# Patient Record
Sex: Male | Born: 1980 | Hispanic: No | Marital: Married | Smoking: Never smoker
Health system: Southern US, Community
[De-identification: ages and names within clinical notes are randomized; demographics above are authoritative.]

---

## 2010-03-06 ENCOUNTER — Encounter: Admission: RE | Admit: 2010-03-06 | Discharge: 2010-03-06 | Payer: Self-pay | Admitting: Orthopaedic Surgery

## 2015-01-17 ENCOUNTER — Other Ambulatory Visit: Payer: Self-pay | Admitting: Hematology

## 2015-01-17 ENCOUNTER — Other Ambulatory Visit: Payer: Self-pay | Admitting: Orthopedic Surgery

## 2015-01-17 ENCOUNTER — Other Ambulatory Visit: Payer: Self-pay

## 2015-01-17 DIAGNOSIS — M545 Low back pain: Secondary | ICD-10-CM

## 2016-03-18 ENCOUNTER — Emergency Department (HOSPITAL_COMMUNITY): Admission: EM | Admit: 2016-03-18 | Discharge: 2016-03-18 | Discharge status: Absent without leave | Payer: Self-pay

## 2016-06-04 ENCOUNTER — Telehealth (INDEPENDENT_AMBULATORY_CARE_PROVIDER_SITE_OTHER): Payer: Self-pay | Admitting: *Deleted

## 2016-06-04 NOTE — Telephone Encounter (Signed)
IC and advised we have not seen patient since 12/2014, and appt will be needed.  Patient is only in town on Friday afternoons, appt made for 4pm with Erin on 06/11/16

## 2016-06-04 NOTE — Telephone Encounter (Signed)
Patient's wife called this morning in regards to wanting a prescription for pain medicine. CB # [phone]. Thank you.

## 2016-06-11 ENCOUNTER — Ambulatory Visit (INDEPENDENT_AMBULATORY_CARE_PROVIDER_SITE_OTHER): Payer: Managed Care, Other (non HMO) | Admitting: Family

## 2016-06-11 DIAGNOSIS — M545 Low back pain, unspecified: Secondary | ICD-10-CM

## 2016-06-11 DIAGNOSIS — G8929 Other chronic pain: Secondary | ICD-10-CM | POA: Diagnosis not present

## 2016-06-11 MED ORDER — HYDROCODONE-ACETAMINOPHEN 5-325 MG PO TABS: 1.0000 | ORAL_TABLET | Freq: Four times a day (QID) | ORAL | 0 refills | Status: DC | PRN

## 2016-06-11 MED ORDER — METHOCARBAMOL 500 MG PO TABS: 500.0000 mg | ORAL_TABLET | Freq: Four times a day (QID) | ORAL | 0 refills | Status: AC

## 2016-06-14 NOTE — Progress Notes (Signed)
   Office Visit Note   Patient: Colin Bartlett           Date of Birth: 03/29/1981           MRN: [id number] Visit Date: 06/11/2016              Requested by: No referring provider defined for this encounter. PCP: No primary care provider on file.  No chief complaint on file.   HPI: The patient is a 36 year old gentleman who presents today for evaluation of chronic low back pain that has acutely worsened. Does manual labor for work. Complains of worse right sided low back pain. No recent injury. No numbness or tingling. No weakness. Some soft tissue pain with spasm on the right.   This is worst in the morning. Pain interferres with his ability to work.     Assessment & Plan: Visit Diagnoses:  1. Chronic left-sided low back pain without sciatica     Plan: Have provided a short course of Norco as well as prescription for Robaxin. Reinforced back strengthening exercises. Will follow up in office as needed.   Follow-Up Instructions: No Follow-up on file.   Physical Exam  Constitutional: Appears well-developed.  Head: Normocephalic.  Eyes: EOM are normal.  Neck: Normal range of motion.  Cardiovascular: Normal rate.   Pulmonary/Chest: Effort normal.  Neurological: Is alert.  Skin: Skin is warm.  Psychiatric: Has a normal mood and affect.  Back Exam   Tenderness  Back tenderness location: no spinous process tenderness, soft tissue tenderness on right.  Range of Motion  The patient has normal back ROM.  Other  Sensation: normal Gait: normal   Comments:  No palpable spasm.       Imaging: No results found.  Orders:  No orders of the defined types were placed in this encounter.  Meds ordered this encounter  Medications  . HYDROcodone-acetaminophen (NORCO/VICODIN) 5-325 MG tablet    Sig: Take 1 tablet by mouth every 6 (six) hours as needed for moderate pain.    Dispense:  28 tablet    Refill:  0  . methocarbamol (ROBAXIN) 500 MG tablet    Sig: Take 1  tablet (500 mg total) by mouth 4 (four) times daily.    Dispense:  30 tablet    Refill:  0     Procedures: No procedures performed  Clinical Data: No additional findings.  Subjective: Review of Systems  Objective: Vital Signs: There were no vitals taken for this visit.  Specialty Comments:  No specialty comments available.  PMFS History: There are no active problems to display for this patient.  No past medical history on file.  No family history on file.  No past surgical history on file. Social History   Occupational History  . Not on file.   Social History Main Topics  . Smoking status: Not on file  . Smokeless tobacco: Not on file  . Alcohol use Not on file  . Drug use: Unknown  . Sexual activity: Not on file

## 2017-02-18 ENCOUNTER — Telehealth (INDEPENDENT_AMBULATORY_CARE_PROVIDER_SITE_OTHER): Payer: Self-pay | Admitting: Orthopaedic Surgery

## 2017-02-18 ENCOUNTER — Ambulatory Visit (INDEPENDENT_AMBULATORY_CARE_PROVIDER_SITE_OTHER): Payer: Managed Care, Other (non HMO) | Admitting: Orthopaedic Surgery

## 2017-02-18 NOTE — Telephone Encounter (Signed)
Patient last saw Denny Peonrin in February. He was to return prn. Would you like for me to call him and have him make appt?

## 2017-02-18 NOTE — Telephone Encounter (Signed)
Patients wife called on behalf of her husband, asking for a refill on his hydrocodone. CB # [phone]

## 2017-05-20 ENCOUNTER — Telehealth (INDEPENDENT_AMBULATORY_CARE_PROVIDER_SITE_OTHER): Payer: Self-pay | Admitting: Orthopaedic Surgery

## 2017-05-20 NOTE — Telephone Encounter (Signed)
Hydrocodone-acetaminophen

## 2017-05-20 NOTE — Telephone Encounter (Signed)
Please call pt is earlier appt opens pt is in a lot of pain

## 2017-05-20 NOTE — Telephone Encounter (Signed)
I left voicemail. Advised we are unable to refill pain medications as patient was last seen in our office February of 2018.  Advised to call and make return office visit if continued problems. Medication would be addressed at appointment if appropriate.

## 2017-05-24 NOTE — Telephone Encounter (Signed)
Can you work this patient in to the schedule tomorrow if there is time. He is established patient who has back pain. Thanks.

## 2017-05-24 NOTE — Telephone Encounter (Signed)
Left message for patient to return call.

## 2017-05-27 ENCOUNTER — Ambulatory Visit (INDEPENDENT_AMBULATORY_CARE_PROVIDER_SITE_OTHER): Payer: Managed Care, Other (non HMO)

## 2017-05-27 ENCOUNTER — Ambulatory Visit (INDEPENDENT_AMBULATORY_CARE_PROVIDER_SITE_OTHER): Payer: Managed Care, Other (non HMO) | Admitting: Orthopaedic Surgery

## 2017-05-27 ENCOUNTER — Encounter (INDEPENDENT_AMBULATORY_CARE_PROVIDER_SITE_OTHER): Payer: Self-pay | Admitting: Orthopaedic Surgery

## 2017-05-27 VITALS — BP 137/93 | HR 80 | Ht 67.0 in | Wt 195.0 lb

## 2017-05-27 DIAGNOSIS — M545 Low back pain: Secondary | ICD-10-CM | POA: Diagnosis not present

## 2017-05-27 DIAGNOSIS — M48061 Spinal stenosis, lumbar region without neurogenic claudication: Secondary | ICD-10-CM

## 2017-06-17 ENCOUNTER — Ambulatory Visit (INDEPENDENT_AMBULATORY_CARE_PROVIDER_SITE_OTHER): Payer: Managed Care, Other (non HMO) | Admitting: Orthopaedic Surgery

## 2017-06-27 ENCOUNTER — Encounter (INDEPENDENT_AMBULATORY_CARE_PROVIDER_SITE_OTHER): Payer: Self-pay | Admitting: Orthopaedic Surgery

## 2017-06-27 NOTE — Progress Notes (Signed)
Office Visit Note   Patient: Colin Bartlett           Date of Birth: 1980/05/04           MRN: [id number] Visit Date: 05/27/2017              Requested by: No referring provider defined for this encounter. PCP: Patient, No Pcp Per   Assessment & Plan: Visit Diagnoses:  1. Acute bilateral low back pain, with sciatica presence unspecified   2. Spinal stenosis of lumbar region without neurogenic claudication     Plan: Patient has some disc degeneration and stenosis noted on previous myelogram CT scan 2011 most significant at L4-5.  If symptoms worsen we discussed potential epidural injection.  If this is not effective he would require repeat imaging before consideration of surgical intervention. Follow-Up Instructions: Return if symptoms worsen or fail to improve.   Orders:  Orders Placed This Encounter  Procedures  . XR Lumbar Spine 2-3 Views   No orders of the defined types were placed in this encounter.     Procedures: No procedures performed   Clinical Data: No additional findings.   Subjective: Chief Complaint  Patient presents with  . Lower Back - Pain    HPI 37 year old male [place] in [place] seen with ongoing problems with low back pain.  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  He has no persistent numbness or tingling occasionally has had spasms in his right leg that extend to the calf the last for short period of time.  He has had problems with his back in the past.  He denies recent injury.  Denies weakness. Previous lumbar myelogram CT scan 2011 showed moderate central stenosis at L4-5, central and right disc extrusion at L5-S1 with facet arthropathy.  Increased stenosis with flexion at L4-5.  Central protrusions at L1-2, L2-3 and L3-4. Review of Systems 14 point review of systems positive for history of GI reflux.  Impingement right  shoulder.  Non-smoker.   Objective: Vital Signs: BP (!) 137/93   Pulse 80   Ht 5\' 7"  (1.702 m)   Wt 195 lb (88.5 kg)   BMI 30.54 kg/m   Physical Exam  Constitutional: He is oriented to person, place, and time. He appears well-developed and well-nourished.  HENT:  Head: Normocephalic and atraumatic.  Eyes: EOM are normal. Pupils are equal, round, and reactive to light.  Neck: No tracheal deviation present. No thyromegaly present.  Cardiovascular: Normal rate.  Pulmonary/Chest: Effort normal. He has no wheezes.  Abdominal: Soft. Bowel sounds are normal.  Neurological: He is alert and oriented to person, place, and time.  Skin: Skin is warm and dry. Capillary refill takes less than 2 seconds.  Psychiatric: He has a normal mood and affect. His behavior is normal. Judgment and thought content normal.    Ortho Exam patient has some pain with palpation lumbar spine.  Mild sciatic notch tenderness some increased pain with straight leg raising at 80 degrees on the right.  Anterior tib EHL is intact reflexes are intact.  He is able to heel and toe walk.  Specialty Comments:  No specialty comments available.  Imaging: Xr Lumbar Spine 2-3 Views  Result Date: 06/27/2017 AP and lateral lumbar spine x-rays are obtained and reviewed.  This shows some right lower quadrant vascular metal clips.  No spondylolisthesis.  No acute fracture.  Mild calcification posterior longitudinal ligament noted at L3, L4 and L5.  Pars are intact. Impression: lumbar spine x-rays negative for acute changes    PMFS History: There are no active problems to display for this patient.  History reviewed. No pertinent past medical history.  History reviewed. No pertinent family history.  History reviewed. No pertinent surgical history. Social History   Occupational History  . Not on file  Tobacco Use  . Smoking status: Never Smoker  . Smokeless tobacco: Never Used  Substance and Sexual Activity  . Alcohol use:  No    Frequency: Never  . Drug use: No  . Sexual activity: No

## 2017-12-12 ENCOUNTER — Telehealth (INDEPENDENT_AMBULATORY_CARE_PROVIDER_SITE_OTHER): Payer: Self-pay | Admitting: Orthopaedic Surgery

## 2017-12-12 MED ORDER — ACETAMINOPHEN-CODEINE #3 300-30 MG PO TABS: 1.0000 | ORAL_TABLET | Freq: Two times a day (BID) | ORAL | 0 refills | Status: DC | PRN

## 2017-12-12 NOTE — Telephone Encounter (Signed)
Called medication to pharmacy

## 2017-12-12 NOTE — Telephone Encounter (Signed)
Fonnie BirkenheadGuilda called, she states the [place] [facility] [place] at [address]. Also she states patient is at work but she will check with him and call back tomorrow to let you know if he would like to proceed with new MRI. # [phone]

## 2017-12-12 NOTE — Telephone Encounter (Signed)
Please advise 

## 2017-12-12 NOTE — Telephone Encounter (Signed)
I left voicemail requesting return call with name of pharmacy patient would like med called to and to let me know if they would like to proceed with MRI order.

## 2017-12-12 NOTE — Telephone Encounter (Signed)
OK for tylenol # 3   # 20 1 po bid prn. If he is still hurting then needs new MRI and may need surgery to correct . ucall thanks

## 2017-12-12 NOTE — Telephone Encounter (Signed)
Patient requesting rx refill on hydrocodone. CB # [phone]

## 2019-04-02 ENCOUNTER — Telehealth: Payer: Self-pay | Admitting: Orthopaedic Surgery

## 2019-04-02 MED ORDER — ACETAMINOPHEN-CODEINE #3 300-30 MG PO TABS: 1.0000 | ORAL_TABLET | Freq: Two times a day (BID) | ORAL | 0 refills | Status: DC | PRN

## 2019-04-02 NOTE — Telephone Encounter (Signed)
Patient called and stated she needed refill for Hydrocodone  For patient  [phone]

## 2019-04-02 NOTE — Telephone Encounter (Signed)
Called to pharmacy. I left voicemail for patient advising. °

## 2019-04-02 NOTE — Telephone Encounter (Signed)
Ok refill tylenol # 3 same as before thank you.

## 2019-04-02 NOTE — Telephone Encounter (Signed)
Please advise 

## 2019-04-03 NOTE — Telephone Encounter (Signed)
Pt called in returning your call, I did advise the pt that the medication had been sent to the pharmacy but pt states she called the pharmacy and they said they have not received anything? Pharmacy is [facility] on [address]  Please give pt a call    [phone]

## 2019-04-03 NOTE — Telephone Encounter (Signed)
I called and spoke with pharmacy. Med has been filled and is ready to pick up. I left voicemail advising.

## 2019-10-18 ENCOUNTER — Other Ambulatory Visit: Payer: Self-pay

## 2019-10-18 ENCOUNTER — Ambulatory Visit (INDEPENDENT_AMBULATORY_CARE_PROVIDER_SITE_OTHER): Payer: Managed Care, Other (non HMO) | Admitting: Orthopaedic Surgery

## 2019-10-18 ENCOUNTER — Telehealth: Payer: Self-pay | Admitting: Orthopaedic Surgery

## 2019-10-18 ENCOUNTER — Ambulatory Visit: Payer: Self-pay

## 2019-10-18 DIAGNOSIS — M545 Low back pain, unspecified: Secondary | ICD-10-CM

## 2019-10-18 MED ORDER — TIZANIDINE HCL 4 MG PO TABS: 4.0000 mg | ORAL_TABLET | Freq: Three times a day (TID) | ORAL | 0 refills | Status: DC | PRN

## 2019-10-18 MED ORDER — METHYLPREDNISOLONE 4 MG PO TABS: ORAL_TABLET | ORAL | 0 refills | Status: DC

## 2019-10-18 MED ORDER — HYDROCODONE-ACETAMINOPHEN 5-325 MG PO TABS: 1.0000 | ORAL_TABLET | Freq: Four times a day (QID) | ORAL | 0 refills | Status: DC | PRN

## 2019-10-18 NOTE — Telephone Encounter (Signed)
Patient's wife called.  Thinks the patient pulled something and wanted to be seen today. I informed her that Dr.Yates is not here and that I'd have to check with him before scheduling with another provider.   Call back: [phone]

## 2019-10-18 NOTE — Telephone Encounter (Signed)
Patient is scheduled with Dr. Magnus Ivan

## 2019-10-18 NOTE — Progress Notes (Signed)
Office Visit Note   Patient: Colin Bartlett           Date of Birth: 05/28/1980           MRN: [id number] Visit Date: 10/18/2019              Requested by: No referring provider defined for this encounter. PCP: Patient, No Pcp Per   Assessment & Plan: Visit Diagnoses:  1. Low back pain, unspecified back pain laterality, unspecified chronicity, unspecified whether sciatica present     Plan: This seems to be more of a musculoskeletal strain.  I recommended deep tissue massage as well as a combination of a 6-day steroid taper, hydrocodone and Zanaflex.  We will also keep him out of work the next 2 weeks.  He will alternate ice and heat.  If this does not improve will recommend outpatient physical therapy but hopefully it will improve.  All question concerns were answered and addressed.  Follow-Up Instructions: Return in about 2 weeks (around 11/01/2019).   Orders:  Orders Placed This Encounter  Procedures  . XR Lumbar Spine 2-3 Views   Meds ordered this encounter  Medications  . methylPREDNISolone (MEDROL) 4 MG tablet    Sig: Medrol dose pack. Take as instructed    Dispense:  21 tablet    Refill:  0  . tiZANidine (ZANAFLEX) 4 MG tablet    Sig: Take 1 tablet (4 mg total) by mouth every 8 (eight) hours as needed for muscle spasms.    Dispense:  30 tablet    Refill:  0  . DISCONTD: HYDROcodone-acetaminophen (NORCO/VICODIN) 5-325 MG tablet    Sig: Take 1 tablet by mouth every 6 (six) hours as needed for moderate pain.    Dispense:  28 tablet    Refill:  0  . HYDROcodone-acetaminophen (NORCO/VICODIN) 5-325 MG tablet    Sig: Take 1 tablet by mouth every 6 (six) hours as needed for moderate pain.    Dispense:  28 tablet    Refill:  0      Procedures: No procedures performed   Clinical Data: No additional findings.   Subjective: Chief Complaint  Patient presents with  . Lower Back - Pain  The patient is someone I am seeing for the first time today but he is a patient  of Dr. Ophelia Charter.  He had lumbar spine surgery about 9 years ago according to the patient.  He is non-English-speaking but has family with him to interpret.  He had been doing very well until he was lifting something heavy this past Monday when he injured his back.  He points to the mid lumbar spine into the thoracic area and periscapular on the right side a source of his pain.  He has no low back pain.  He has no radicular symptoms going down either leg.  He denies any weakness or numbness and tingling.  He denies any change in bowel or bladder function.  His only the area to the right side of his spine and not in the midline and again this seems to be between the lower thoracic and upper lumbar spine and just near the scapular area on the right side only.  Before this past Monday he has had no issues.  He does work in Holiday representative.  He has been out of work since Monday.  HPI  Review of Systems He currently denies any headache, chest pain, shortness of breath, fever, chills, nausea, vomiting  Objective: Vital Signs: There were no vitals  taken for this visit.  Physical Exam He is alert and orient x3 and in no acute distress Ortho Exam Examination of his lumbar spine does show some paraspinal muscular pain to the right side of the mid lumbar and lower thoracic area.  The remainder of his bilateral lower extremity and upper extremity clinical exam is entirely normal. Specialty Comments:  No specialty comments available.  Imaging: XR Lumbar Spine 2-3 Views  Result Date: 10/18/2019 2 views of the lumbar spine show no acute findings other than some loss of the lumbar lordosis indicative of muscle strain.    PMFS History: There are no problems to display for this patient.  No past medical history on file.  No family history on file.  No past surgical history on file. Social History   Occupational History  . Not on file  Tobacco Use  . Smoking status: Never Smoker  . Smokeless tobacco: Never  Used  Substance and Sexual Activity  . Alcohol use: No  . Drug use: No  . Sexual activity: Never

## 2019-10-18 NOTE — Telephone Encounter (Signed)
Could you please call patient and assess whether he should be worked in with someone?  Dr. Ophelia Charter is not back in the office until next week.  Thanks.

## 2019-10-19 ENCOUNTER — Telehealth: Payer: Self-pay | Admitting: Orthopaedic Surgery

## 2019-10-19 NOTE — Telephone Encounter (Signed)
Patient had questions about prednisone dose pak instructions. Medication came together in one bottle with instruction "take as directed" no additional instructions.  Advise patient to take Rx back to pharmacy or call pharmacy for instructions.

## 2019-10-19 NOTE — Telephone Encounter (Signed)
Patient's wife called.  They need clarification on the instructions for his new prescriptions  Call back: [phone]

## 2019-11-06 ENCOUNTER — Other Ambulatory Visit: Payer: Self-pay

## 2019-11-06 ENCOUNTER — Ambulatory Visit (INDEPENDENT_AMBULATORY_CARE_PROVIDER_SITE_OTHER): Payer: Managed Care, Other (non HMO) | Admitting: Orthopaedic Surgery

## 2019-11-06 ENCOUNTER — Ambulatory Visit (INDEPENDENT_AMBULATORY_CARE_PROVIDER_SITE_OTHER): Payer: Managed Care, Other (non HMO)

## 2019-11-06 DIAGNOSIS — M79675 Pain in left toe(s): Secondary | ICD-10-CM

## 2019-11-06 MED ORDER — ACETAMINOPHEN-CODEINE #3 300-30 MG PO TABS: 1.0000 | ORAL_TABLET | Freq: Two times a day (BID) | ORAL | 0 refills | Status: AC | PRN

## 2019-11-06 NOTE — Progress Notes (Signed)
Office Visit Note   Patient: Colin Bartlett           Date of Birth: [birth year]           MRN: [id number] Visit Date: 11/06/2019              Requested by: No referring provider defined for this encounter. PCP: Patient, No Pcp Per   Assessment & Plan: Visit Diagnoses:  1. Great toe pain, left     Plan: Work note given no work x2 weeks recheck 2 weeks.  Tylenol 3 sent in for pain he can take ibuprofen for the inflammation keep his foot elevated above his heart.  Recheck 2 weeks.  Follow-Up Instructions: No follow-ups on file.   Orders:  Orders Placed This Encounter  Procedures  . XR Toe Great Left   No orders of the defined types were placed in this encounter.     Procedures: No procedures performed   Clinical Data: No additional findings.   Subjective: Chief Complaint  Patient presents with  . 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  He was barefoot and caught his great toe on the bottom of his trousers with medial angulation injury at the first MP TP joint.  No history of gout.  He states he has had severe pain great difficulty walking.  He is concerned that he may have a fracture.  He said swelling in his toe feels better in the sock.  He states he has difficulty ambulating at this point.  Patient has seen Dr. Magnus Ivan last month for some problems with back pain.  Review of Systems 14 point review of systems otherwise noncontributory.   Objective: Vital Signs: There were no vitals taken for this visit.  Physical Exam Constitutional:      Appearance: He is well-developed.  HENT:     Head: Normocephalic and atraumatic.  Eyes:     Pupils: Pupils are equal, round, and reactive to light.  Neck:     Thyroid: No thyromegaly.     Trachea: No tracheal deviation.  Cardiovascular:     Rate and Rhythm: Normal rate.  Pulmonary:     Effort: Pulmonary effort is normal.      Breath sounds: No wheezing.  Abdominal:     General: Bowel sounds are normal.     Palpations: Abdomen is soft.  Skin:    General: Skin is warm and dry.     Capillary Refill: Capillary refill takes less than 2 seconds.  Neurological:     Mental Status: He is alert and oriented to person, place, and time.  Psychiatric:        Behavior: Behavior normal.        Thought Content: Thought content normal.        Judgment: Judgment normal.     Ortho Exam patient has swelling left great toe this is a closed injury.  Fusiform swelling tenderness at the first MTP joint.  Sensation of the toes intact he has great difficulty putting sandal back on difficulty with standing and walking.  Specialty Comments:  No specialty comments available.  Imaging: No results found.   PMFS History: There are no problems to display for this patient.  No past medical history on file.  No family history on file.  No past surgical history on file. Social History   Occupational History  . Not on file  Tobacco Use  . Smoking status: Never Smoker  . Smokeless tobacco: Never Used  Substance and Sexual Activity  . Alcohol use: No  . Drug use: No  . Sexual activity: Never

## 2019-11-07 ENCOUNTER — Telehealth: Payer: Self-pay | Admitting: Orthopaedic Surgery

## 2019-11-07 NOTE — Telephone Encounter (Signed)
Can u call it in , in  Epic , somehow never went thru    tylenol # 3 thanks

## 2019-11-07 NOTE — Telephone Encounter (Signed)
Called in.

## 2019-11-07 NOTE — Telephone Encounter (Signed)
Patient's wife Fonnie Birkenhead called stating pharmacy still hasn't received refill. Please call patient with updates of medication called in at [phone].

## 2019-11-07 NOTE — Telephone Encounter (Signed)
Called wife back and she  states Rx was never called into pharmacy. Can you please send in RX. He uses [facility] in [place] [address].

## 2019-11-07 NOTE — Telephone Encounter (Signed)
See below. Can you please call this in?

## 2019-11-07 NOTE — Telephone Encounter (Signed)
Patient's wife Fonnie Birkenhead called advised the Rx was not called into the pharmacy yet. The number to contact Fonnie Birkenhead is [phone]

## 2019-11-07 NOTE — Telephone Encounter (Signed)
Patient wife notified.

## 2019-11-08 ENCOUNTER — Ambulatory Visit: Payer: Managed Care, Other (non HMO) | Admitting: Orthopaedic Surgery

## 2019-11-08 NOTE — Telephone Encounter (Signed)
Will call and check.  Gave verbal to pharmacist, yesterday evening after 5 at [facility] listed below.    [id number] - [place], [place] - [address] AT [facility] [address]  [address], [place] [address]  Phone:  [phone]  Fax:  [phone]

## 2019-11-08 NOTE — Telephone Encounter (Signed)
Can you please call pharmacy and verify they did or did not receive it? If not can you please provide them with verbal and contact patient?

## 2019-11-08 NOTE — Telephone Encounter (Signed)
Spoke with wife. They did pick up Rx.

## 2020-04-11 ENCOUNTER — Ambulatory Visit: Payer: Managed Care, Other (non HMO) | Admitting: Orthopaedic Surgery

## 2020-06-11 ENCOUNTER — Ambulatory Visit (INDEPENDENT_AMBULATORY_CARE_PROVIDER_SITE_OTHER): Payer: Managed Care, Other (non HMO) | Admitting: Orthopaedic Surgery

## 2020-06-11 ENCOUNTER — Ambulatory Visit (INDEPENDENT_AMBULATORY_CARE_PROVIDER_SITE_OTHER): Payer: Managed Care, Other (non HMO)

## 2020-06-11 DIAGNOSIS — M545 Low back pain, unspecified: Secondary | ICD-10-CM | POA: Diagnosis not present

## 2020-06-11 MED ORDER — METHYLPREDNISOLONE 4 MG PO TABS: ORAL_TABLET | ORAL | 0 refills | Status: AC

## 2020-06-11 MED ORDER — TIZANIDINE HCL 4 MG PO TABS: 4.0000 mg | ORAL_TABLET | Freq: Three times a day (TID) | ORAL | 0 refills | Status: AC | PRN

## 2020-06-11 MED ORDER — HYDROCODONE-ACETAMINOPHEN 5-325 MG PO TABS: 1.0000 | ORAL_TABLET | Freq: Four times a day (QID) | ORAL | 0 refills | Status: AC | PRN

## 2020-06-11 NOTE — Progress Notes (Signed)
Office Visit Note   Patient: Colin Bartlett           Date of Birth: November 26, 1980           MRN: [id number] Visit Date: 06/11/2020              Requested by: No referring provider defined for this encounter. PCP: Patient, No Pcp Per   Assessment & Plan: Visit Diagnoses:  1. Bilateral low back pain, unspecified chronicity, unspecified whether sciatica present     Plan: Given his degree of discomfort, I would like to obtain an MRI of the lumbar spine to rule out herniated disc.,  To put him on a 6-day steroid taper as well as Zanaflex and hydrocodone.  I will give him a note to keep him out of work as well.  We will order the MRI and his family knows to set up a follow-up appointment once he has this MRI obtained.  All questions and concerns were answered and addressed.  Follow-Up Instructions: No follow-ups on file.   Orders:  Orders Placed This Encounter  Procedures  . XR Lumbar Spine 2-3 Views   Meds ordered this encounter  Medications  . methylPREDNISolone (MEDROL) 4 MG tablet    Sig: Medrol dose pack. Take as instructed    Dispense:  21 tablet    Refill:  0  . tiZANidine (ZANAFLEX) 4 MG tablet    Sig: Take 1 tablet (4 mg total) by mouth every 8 (eight) hours as needed for muscle spasms.    Dispense:  30 tablet    Refill:  0  . HYDROcodone-acetaminophen (NORCO/VICODIN) 5-325 MG tablet    Sig: Take 1-2 tablets by mouth every 6 (six) hours as needed for moderate pain.    Dispense:  30 tablet    Refill:  0      Procedures: No procedures performed   Clinical Data: No additional findings.   Subjective: Chief Complaint  Patient presents with  . Lower Back - Pain  The patient comes in today with acute and quite severe low back pain with radicular symptoms going down both his legs.  He has significant muscle spasms as well.  He was bending down in the yard on 11 February when he felt a sudden onset of pain and muscle spasms.  He went to the emergency room.  No x-rays  were obtained and they just put him on some ibuprofen.  He does have a remote history of spine surgery that was done in [facility] over a decade ago.  He is also followed up in the past with Dr. Ophelia Charter here in our office for his back.  He has no hardware in the back.  Is not a diabetic.  Prior to this injury few days ago he was doing well.  He denies any change in bowel bladder function.  He does report some numbness in his feet.  HPI  Review of Systems He currently denies any headache, chest pain, shortness of breath, fever, chills, nausea, vomiting  Objective: Vital Signs: There were no vitals taken for this visit.  Physical Exam He is alert and oriented and in no acute distress but obvious discomfort. Ortho Exam It is difficult for him to walk upright due to the pain in his back.  He has a significantly positive straight leg raise bilaterally and is quite uncomfortable on my exam. Specialty Comments:  No specialty comments available.  Imaging: XR Lumbar Spine 2-3 Views  Result Date: 06/11/2020 2  views of the lumbar spine show loss of lumbar lordosis but no obvious acute injury or fracture.    PMFS History: There are no problems to display for this patient.  No past medical history on file.  No family history on file.  No past surgical history on file. Social History   Occupational History  . Not on file  Tobacco Use  . Smoking status: Never Smoker  . Smokeless tobacco: Never Used  Substance and Sexual Activity  . Alcohol use: No  . Drug use: No  . Sexual activity: Never

## 2020-06-12 ENCOUNTER — Other Ambulatory Visit: Payer: Self-pay

## 2020-06-12 DIAGNOSIS — M4807 Spinal stenosis, lumbosacral region: Secondary | ICD-10-CM

## 2020-06-21 ENCOUNTER — Ambulatory Visit (HOSPITAL_BASED_OUTPATIENT_CLINIC_OR_DEPARTMENT_OTHER): Payer: Managed Care, Other (non HMO)

## 2020-06-26 ENCOUNTER — Ambulatory Visit: Payer: Managed Care, Other (non HMO) | Admitting: Orthopaedic Surgery

## 2020-06-28 ENCOUNTER — Other Ambulatory Visit: Payer: Self-pay

## 2020-06-28 ENCOUNTER — Encounter (HOSPITAL_BASED_OUTPATIENT_CLINIC_OR_DEPARTMENT_OTHER): Payer: Self-pay

## 2020-06-28 ENCOUNTER — Ambulatory Visit (HOSPITAL_BASED_OUTPATIENT_CLINIC_OR_DEPARTMENT_OTHER)
Admission: RE | Admit: 2020-06-28 | Discharge: 2020-06-28 | Disposition: A | Discharge status: Home / Self Care | Payer: Managed Care, Other (non HMO) | Source: Ambulatory Visit | Attending: Orthopaedic Surgery | Admitting: Orthopaedic Surgery

## 2020-06-28 DIAGNOSIS — M4807 Spinal stenosis, lumbosacral region: Secondary | ICD-10-CM | POA: Diagnosis present

## 2020-06-30 ENCOUNTER — Telehealth: Payer: Self-pay

## 2020-06-30 NOTE — Telephone Encounter (Signed)
I called and left a message on her answering machine.  There was one digit different in terms of contact person showing his wife and that phone number which is 1 digit different than the ones listed.  I did leave a message about treatment options for his lumbar spine.

## 2020-06-30 NOTE — Telephone Encounter (Signed)
Patients wife called patient has a appointment Wednesday he would like to know if his mri results can be discussed over the phone due to his work schedule call back:[phone]

## 2020-07-01 ENCOUNTER — Telehealth: Payer: Self-pay | Admitting: Orthopaedic Surgery

## 2020-07-01 NOTE — Telephone Encounter (Signed)
Please advise 

## 2020-07-01 NOTE — Telephone Encounter (Signed)
Patient's wife Fonnie Birkenhead called requesting that patient is interested in injections. She states patient is unsure of what type of injections was discussed with the doctor and would like a return call from Dr. Magnus Ivan, PA Chestine Spore, or Danella Maiers. About type of injections. Please call [phone].

## 2020-07-01 NOTE — Telephone Encounter (Signed)
Appt scheduled

## 2020-07-01 NOTE — Telephone Encounter (Signed)
He just needs to be seen in the office.  This is too difficult to explain over the phone.

## 2020-07-02 ENCOUNTER — Ambulatory Visit: Payer: Managed Care, Other (non HMO) | Admitting: Orthopaedic Surgery

## 2020-07-24 ENCOUNTER — Ambulatory Visit: Payer: Managed Care, Other (non HMO) | Admitting: Orthopaedic Surgery

## 2020-08-07 ENCOUNTER — Ambulatory Visit: Payer: Managed Care, Other (non HMO) | Admitting: Orthopaedic Surgery

## 2020-08-13 ENCOUNTER — Ambulatory Visit (INDEPENDENT_AMBULATORY_CARE_PROVIDER_SITE_OTHER): Payer: Managed Care, Other (non HMO) | Admitting: Orthopaedic Surgery

## 2020-08-13 ENCOUNTER — Encounter: Payer: Self-pay | Admitting: Orthopaedic Surgery

## 2020-08-13 ENCOUNTER — Ambulatory Visit: Payer: Managed Care, Other (non HMO) | Admitting: Orthopaedic Surgery

## 2020-08-13 DIAGNOSIS — M545 Low back pain, unspecified: Secondary | ICD-10-CM

## 2020-08-13 DIAGNOSIS — M4807 Spinal stenosis, lumbosacral region: Secondary | ICD-10-CM

## 2020-08-13 NOTE — Progress Notes (Signed)
The patient comes in today to go over an MRI of his lumbar spine.  He is someone who has previous history of lumbar spine surgery done I believe in [place].  He is also seen Dr. Ophelia Charter in the past.  His surgeries were right hemilaminectomies and microdiscectomy is at L4-L5 and L5-S1.  When we saw him at the last visit he was having some debilitating pain so MRI was warranted.  He is here for review of this today.  He states that he feels much better overall.  He says he has intermittent pain that comes and goes but it does not radiate down into his legs.  Its only on the right side.  He says sometimes he can go for a year without his back hurting.  He is only 40 years old.  The MRI of his lumbar spine is reviewed and it does show a broad central disc protrusion at L4-L5 but also bulging at L1-L2 and L2-L3 and protrusion at L3-L4.  The foramina at all these levels were patent.  Clinically he is doing well today and has good strength in his lower extremities and no radicular symptoms and really not much pain.  A long thorough discussion about core strengthening exercises and not gaining weight and back strengthening exercises.  Right now no intervention is really needed since his pain is not persistent and he can go for long periods of time without any discomfort.  To me the neck step for him would be guided physical therapy if he continues to have intermittent problems.  If things worsen he will let us know.  I would recommend then a follow-up with Dr. Ophelia Charter.

## 2020-10-14 ENCOUNTER — Telehealth: Payer: Self-pay | Admitting: Orthopaedic Surgery

## 2020-10-14 NOTE — Telephone Encounter (Signed)
Received vm from Guilda,pts wife. Stating need to get copy of records. IC,lmvm [phone]. advised the patient will need to come in and sign records release form. Once received we can call when ready.

## 2020-10-15 ENCOUNTER — Telehealth: Payer: Self-pay | Admitting: Orthopaedic Surgery

## 2020-10-15 NOTE — Telephone Encounter (Signed)
Received call from pts wife. I advised I left her vm yesterday that patient needs to sign release form. She asked if form could be emailed. I emailed [email or web address]

## 2020-10-16 ENCOUNTER — Telehealth: Payer: Self-pay | Admitting: Orthopaedic Surgery

## 2020-10-16 NOTE — Telephone Encounter (Signed)
IC Gilda,pts wife,lmvm advised records ready to be picked up.

## 2021-10-21 NOTE — Telephone Encounter (Signed)
Pt needs a refill on pain meds

## 2021-10-21 NOTE — Telephone Encounter (Signed)
Please advise 

## 2021-10-21 NOTE — Telephone Encounter (Signed)
Lvm for pt to cb to advise 

## 2021-11-18 ENCOUNTER — Other Ambulatory Visit: Payer: Self-pay | Admitting: Orthopaedic Surgery

## 2021-11-18 ENCOUNTER — Telehealth: Payer: Self-pay | Admitting: Orthopaedic Surgery

## 2021-11-18 MED ORDER — ACETAMINOPHEN-CODEINE 300-30 MG PO TABS: 1.0000 | ORAL_TABLET | Freq: Two times a day (BID) | ORAL | 0 refills | Status: AC | PRN

## 2021-11-18 NOTE — Telephone Encounter (Signed)
Pt called for a pain medicine refill

## 2021-11-19 NOTE — Telephone Encounter (Signed)
LMOM for patient of the below message from [place]

## 2022-04-27 ENCOUNTER — Telehealth: Payer: Self-pay | Admitting: Orthopaedic Surgery

## 2022-04-27 NOTE — Telephone Encounter (Signed)
Patient wants refill on pain medication-please advise..[phone]

## 2023-03-01 IMAGING — MR MR LUMBAR SPINE W/O CM
4 of 6 series · 18 of 48 positions shown · non-contrast
Comparison: Previous radiograph from 06/11/2020 as well as CT from
03/06/2010.

CLINICAL DATA: Initial evaluation for lumbosacral spinal stenosis,
lower back pain, remote history of surgery.

EXAM:
MRI LUMBAR SPINE WITHOUT CONTRAST
TECHNIQUE: Multiplanar, multisequence MR imaging of the lumbar spine was
performed. No intravenous contrast was administered.

[Series 3: T2 · sagittal · 4.0mm · 0.51mm/px · 5 of 13 slices shown (1 of 2)]
[im 1/13]
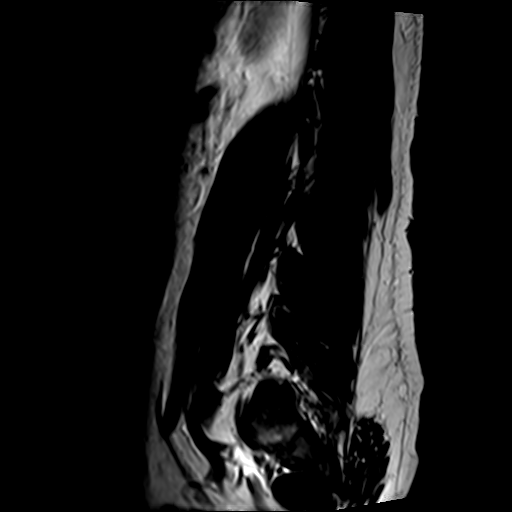
[im 4/13]
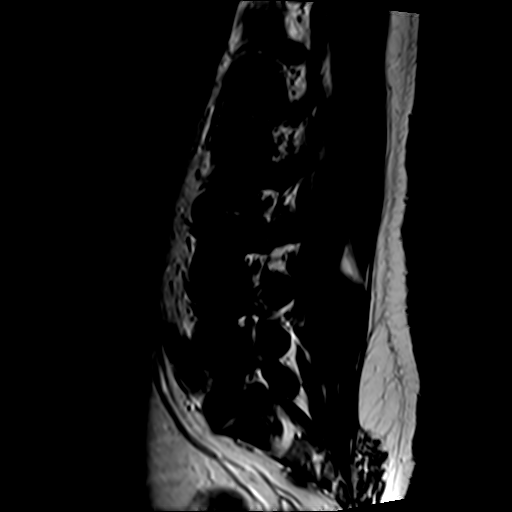
[im 7/13]
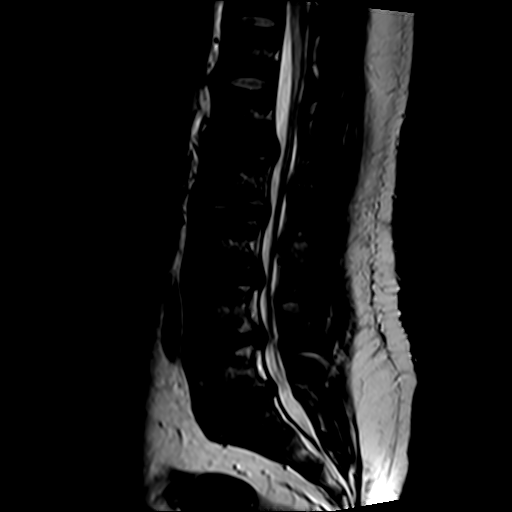
[im 10/13]
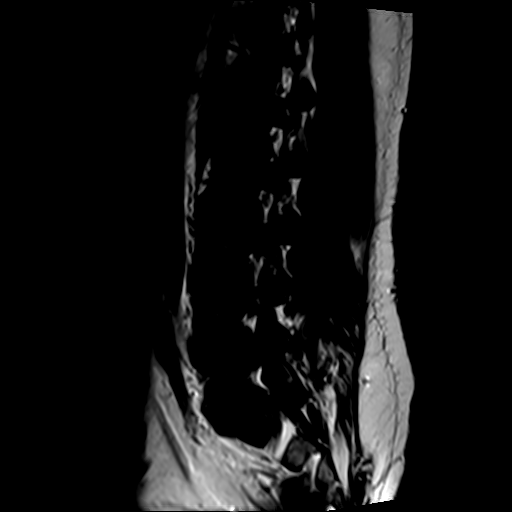
[im 13/13]
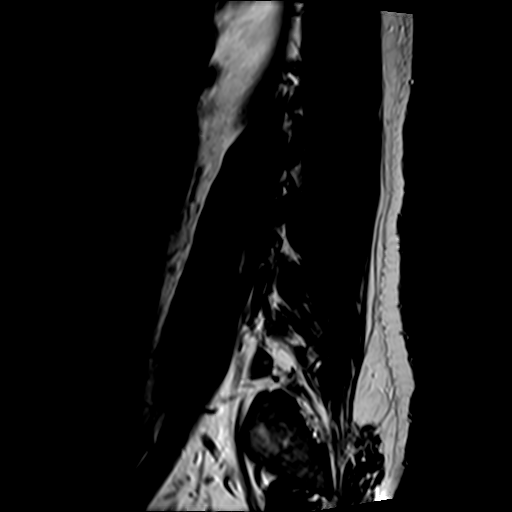

[Series 4: T1 · sagittal · 4.0mm · 0.51mm/px · 3 of 13 slices shown (1 of 2)]
[im 1/13]
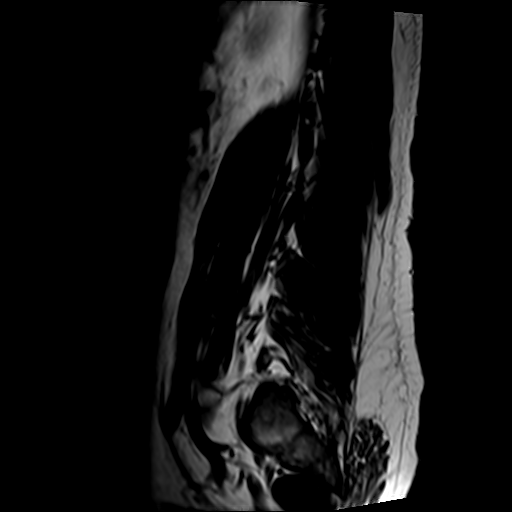
[im 7/13]
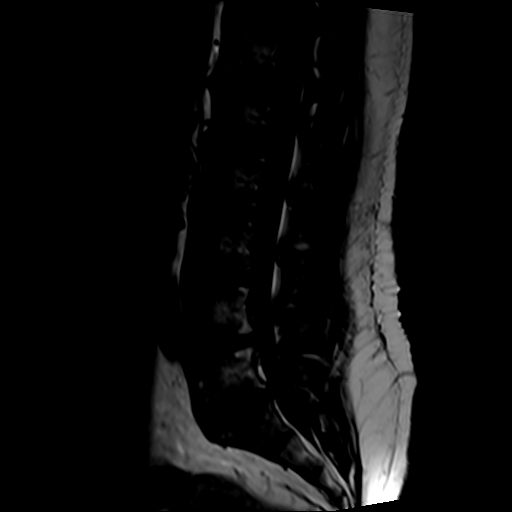
[im 13/13]
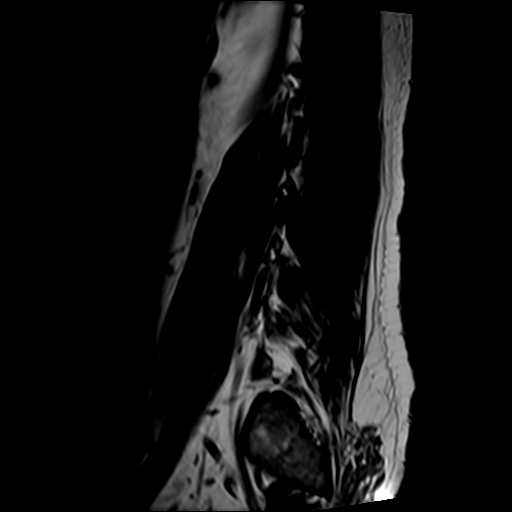

[Series 5: T2 · axial · 4.0mm · 0.39mm/px · z∈[-114,+43]mm · 7 of 35 slices shown (2 of 2)]
[im 1/35]
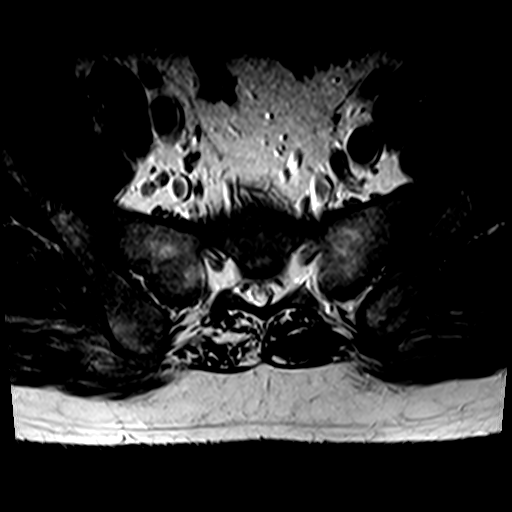
[im 6/35]
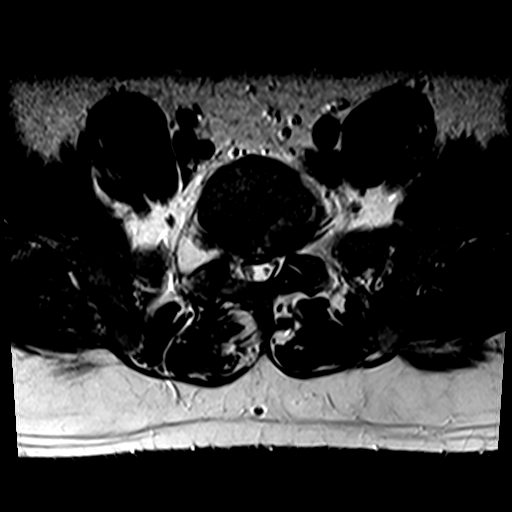
[im 12/35]
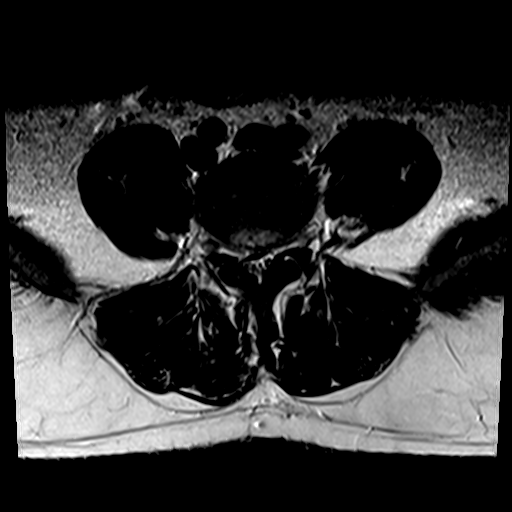
[im 15/35]
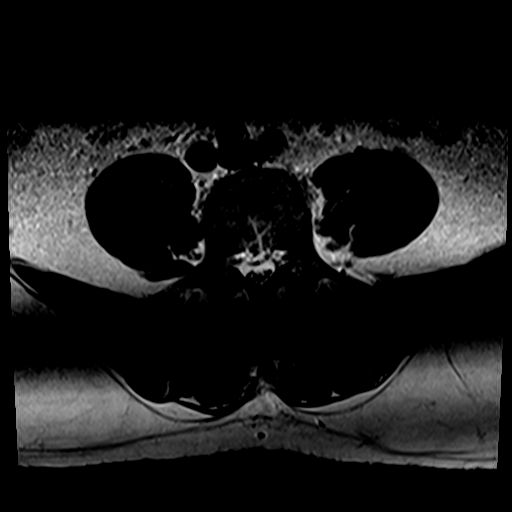
[im 18/35]
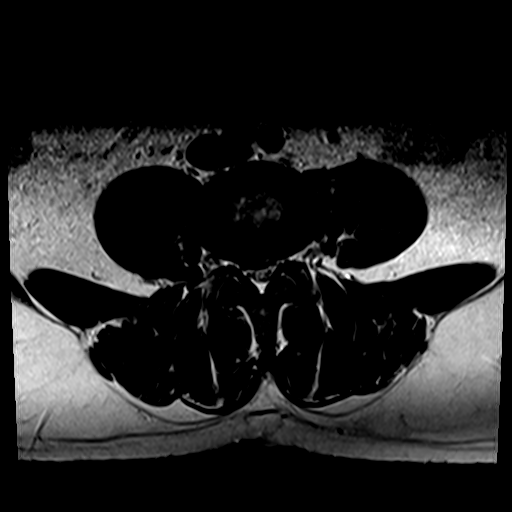
[im 20/35]
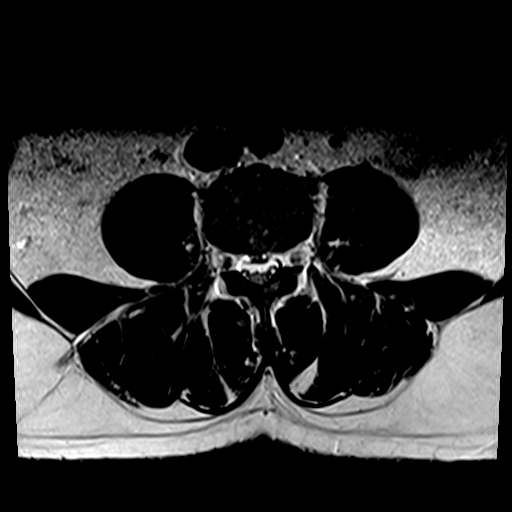
[im 29/35]
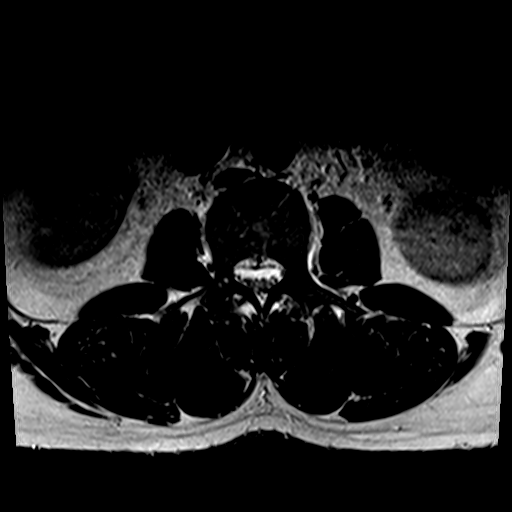

[Series 6: T1 · axial · 4.0mm · 0.39mm/px · z∈[-90,+43]mm · 3 of 35 slices shown (2 of 2)]
[im 6/35]
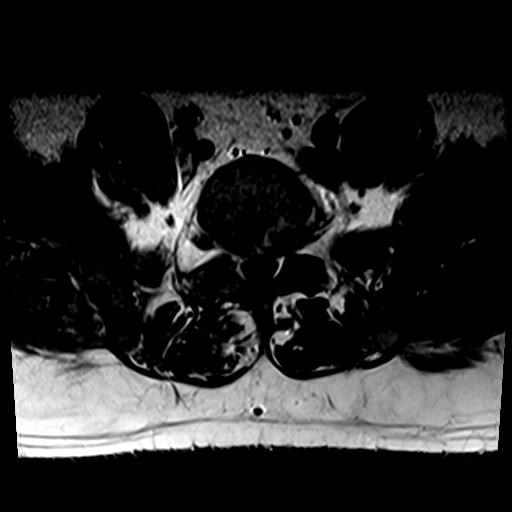
[im 18/35]
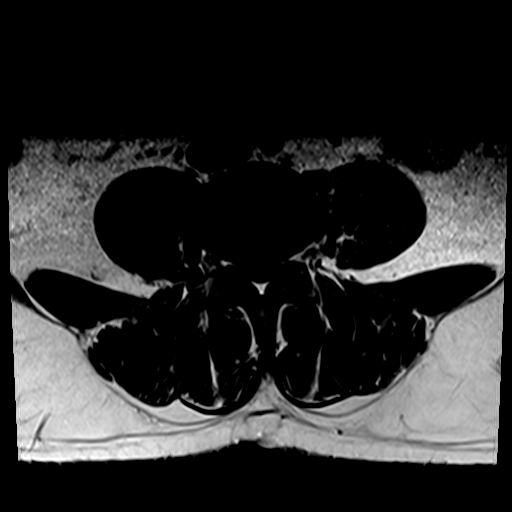
[im 29/35]
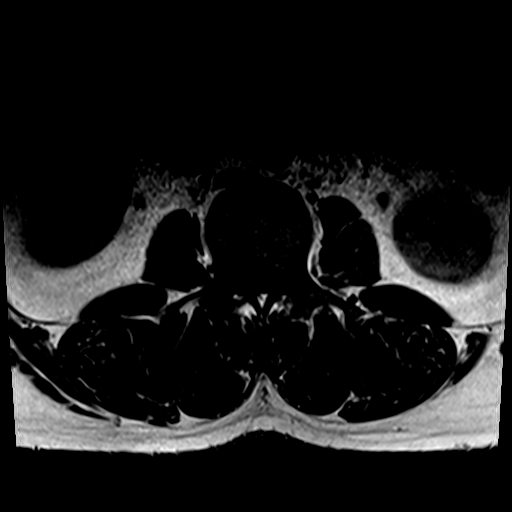

[18 of 48 positions shown; findings below may reference images not displayed]

FINDINGS: Segmentation: Standard. Lowest well-formed disc space labeled the
L5-S1 level.

Alignment: Physiologic with preservation of the normal lumbar
lordosis. No listhesis.

Vertebrae: Vertebral body height maintained without acute or chronic
fracture. Bone marrow signal intensity diffusely heterogeneous
without discrete or worrisome osseous lesion. No abnormal marrow
edema.

Conus medullaris and cauda equina: Conus extends to the T12 level.
Conus and cauda equina appear normal.

Paraspinal and other soft tissues: Paraspinous soft tissues within
normal limits. Visualized visceral structures are normal.

Disc levels:

T12-L1: Unremarkable.

L1-2: Diffuse disc bulge with disc desiccation. Superimposed shallow
central disc protrusion indents the ventral thecal sac. Mild spinal
stenosis. Foramina remain patent.

L2-3: Diffuse disc bulge with disc desiccation. Small central disc
protrusion indents the ventral thecal sac. Associated annular
fissure. Resultant mild spinal stenosis with mild left greater than
right lateral recess narrowing. Foramina remain patent.

L3-4: Diffuse disc bulge with disc desiccation. Superimposed central
to right subarticular disc protrusion with annular fissure. Minimal
facet hypertrophy. Resultant moderate canal with moderate right
worse than left lateral recess stenosis. Foramina remain patent.

L4-5: Diffuse disc bulge with disc desiccation. Broad-based central
disc protrusion indents the ventral thecal sac, slightly eccentric
to the right. Sequelae of prior right hemi laminectomy and probable
micro discectomy. Residual moderate canal with bilateral
subarticular stenosis. Foramina remain patent.

L5-S1: Diffuse disc bulge with disc desiccation. Mild reactive
endplate spurring. Bulging disc mildly indents the ventral thecal
sac without frank neural impingement. Mild right greater than left
facet hypertrophy. Suspected right hemi laminectomy with micro
discectomy. No residual spinal stenosis. Foramina remain patent.
IMPRESSION: 1. Postoperative changes from previous right hemi laminectomy and
micro discectomy at L4-5 and L5-S1.
2. Broad central disc protrusion at L4-5 with resultant moderate
canal and bilateral subarticular stenosis.
3. Central to right subarticular disc protrusion at L3-4 with
resultant moderate canal and right worse than left lateral recess
stenosis.
4. Disc bulging with small central disc protrusions at L1-2 and L2-3
with resultant mild spinal stenosis.

## 2024-01-05 ENCOUNTER — Ambulatory Visit: Admitting: Physician Assistant
